# Patient Record
Sex: Male | Born: 1965 | Race: Black or African American | Hispanic: No | Marital: Married | Smoking: Never smoker
Health system: Southern US, Community
[De-identification: ages and names within clinical notes are randomized; demographics above are authoritative.]

## PROBLEM LIST (undated history)

## (undated) DIAGNOSIS — E119 Type 2 diabetes mellitus without complications: Secondary | ICD-10-CM

## (undated) DIAGNOSIS — K219 Gastro-esophageal reflux disease without esophagitis: Secondary | ICD-10-CM

---

## 1998-04-22 ENCOUNTER — Encounter: Admission: RE | Admit: 1998-04-22 | Discharge: 1998-07-21 | Payer: Self-pay | Admitting: Internal Medicine

## 2000-09-07 ENCOUNTER — Emergency Department (HOSPITAL_COMMUNITY): Admission: EM | Admit: 2000-09-07 | Discharge: 2000-09-07 | Payer: Self-pay | Admitting: *Deleted

## 2005-06-24 ENCOUNTER — Ambulatory Visit: Payer: Self-pay | Admitting: Internal Medicine

## 2005-06-29 ENCOUNTER — Ambulatory Visit: Payer: Self-pay | Admitting: Family Medicine

## 2005-06-30 ENCOUNTER — Ambulatory Visit: Payer: Self-pay | Admitting: Family Medicine

## 2008-06-05 ENCOUNTER — Emergency Department: Payer: Self-pay

## 2017-12-08 ENCOUNTER — Emergency Department: Payer: 59

## 2017-12-08 DIAGNOSIS — M546 Pain in thoracic spine: Secondary | ICD-10-CM | POA: Diagnosis not present

## 2017-12-08 DIAGNOSIS — R079 Chest pain, unspecified: Secondary | ICD-10-CM | POA: Diagnosis present

## 2017-12-08 DIAGNOSIS — E119 Type 2 diabetes mellitus without complications: Secondary | ICD-10-CM | POA: Insufficient documentation

## 2017-12-08 LAB — BASIC METABOLIC PANEL
Anion gap: 9 (ref 5–15)
BUN: 13 mg/dL (ref 6–20)
CHLORIDE: 103 mmol/L (ref 101–111)
CO2: 23 mmol/L (ref 22–32)
CREATININE: 0.95 mg/dL (ref 0.61–1.24)
Calcium: 9.3 mg/dL (ref 8.9–10.3)
GFR calc Af Amer: >60 mL/min (ref >60)
GFR calc non Af Amer: >60 mL/min (ref >60)
GLUCOSE: 155 mg/dL — AB (ref 65–99)
POTASSIUM: 3.8 mmol/L (ref 3.5–5.1)
SODIUM: 135 mmol/L (ref 135–145)

## 2017-12-08 LAB — CBC
HEMATOCRIT: 45.2 % (ref 40.0–52.0)
Hemoglobin: 14.4 g/dL (ref 13.0–18.0)
MCH: 26.4 pg (ref 26.0–34.0)
MCHC: 32 g/dL (ref 32.0–36.0)
MCV: 82.5 fL (ref 80.0–100.0)
Platelets: 208 10*3/uL (ref 150–440)
RBC: 5.47 MIL/uL (ref 4.40–5.90)
RDW: 14.5 % (ref 11.5–14.5)
WBC: 7.2 10*3/uL (ref 3.8–10.6)

## 2017-12-08 LAB — TROPONIN I: Troponin I: <0.03 ng/mL (ref <0.03)

## 2017-12-08 NOTE — ED Triage Notes (Signed)
Patient c/o neck pain, back pain radiating down left arm and weakness. Patient denies SOB, N/V, dizziness/light-headedness.

## 2017-12-09 ENCOUNTER — Emergency Department: Payer: 59

## 2017-12-09 ENCOUNTER — Emergency Department
Admission: EM | Admit: 2017-12-09 | Discharge: 2017-12-09 | Disposition: A | Discharge status: Home / Self Care | Payer: 59 | Attending: Emergency Medicine | Admitting: Emergency Medicine

## 2017-12-09 DIAGNOSIS — M546 Pain in thoracic spine: Secondary | ICD-10-CM

## 2017-12-09 HISTORY — DX: Type 2 diabetes mellitus without complications: E11.9

## 2017-12-09 HISTORY — DX: Gastro-esophageal reflux disease without esophagitis: K21.9

## 2017-12-09 MED ORDER — LIDOCAINE 5 % EX PTCH
1.0000 | MEDICATED_PATCH | Freq: Once | CUTANEOUS | Status: DC
  Administered 2017-12-09: 1 via TRANSDERMAL
  Filled 2017-12-09: qty 1

## 2017-12-09 MED ORDER — ONDANSETRON HCL 4 MG/2ML IJ SOLN
4.0000 mg | Freq: Once | INTRAMUSCULAR | Status: AC
  Administered 2017-12-09: 4 mg via INTRAVENOUS
  Filled 2017-12-09: qty 2

## 2017-12-09 MED ORDER — IOPAMIDOL (ISOVUE-370) INJECTION 76%
75.0000 mL | Freq: Once | INTRAVENOUS | Status: AC | PRN
  Administered 2017-12-09: 75 mL via INTRAVENOUS

## 2017-12-09 MED ORDER — LIDOCAINE 5 % EX PTCH: 1.0000 | MEDICATED_PATCH | Freq: Two times a day (BID) | CUTANEOUS | 0 refills | Status: AC

## 2017-12-09 MED ORDER — MORPHINE SULFATE (PF) 4 MG/ML IV SOLN
8.0000 mg | Freq: Once | INTRAVENOUS | Status: AC
  Administered 2017-12-09: 8 mg via INTRAVENOUS
  Filled 2017-12-09: qty 2

## 2017-12-09 MED ORDER — KETOROLAC TROMETHAMINE 30 MG/ML IJ SOLN
15.0000 mg | Freq: Once | INTRAMUSCULAR | Status: AC
  Administered 2017-12-09: 15 mg via INTRAVENOUS
  Filled 2017-12-09: qty 1

## 2017-12-09 MED ORDER — OXYCODONE-ACETAMINOPHEN 5-325 MG PO TABS: 1.0000 | ORAL_TABLET | ORAL | 0 refills | Status: DC | PRN

## 2017-12-09 MED ORDER — CYCLOBENZAPRINE HCL 10 MG PO TABS: 10.0000 mg | ORAL_TABLET | Freq: Three times a day (TID) | ORAL | 0 refills | Status: AC | PRN

## 2017-12-09 MED ORDER — OXYCODONE-ACETAMINOPHEN 5-325 MG PO TABS
2.0000 | ORAL_TABLET | Freq: Once | ORAL | Status: AC
  Administered 2017-12-09: 2 via ORAL
  Filled 2017-12-09: qty 2

## 2017-12-09 MED ORDER — CYCLOBENZAPRINE HCL 10 MG PO TABS
10.0000 mg | ORAL_TABLET | Freq: Once | ORAL | Status: AC
  Administered 2017-12-09: 10 mg via ORAL
  Filled 2017-12-09: qty 1

## 2017-12-09 NOTE — ED Provider Notes (Signed)
[facility] Emergency Department Provider Note  ____________________________________________   First MD Initiated Contact with Patient 12/09/17 [phone]     (approximate)  I have reviewed the triage vital signs and the nursing notes.   HISTORY  Chief Complaint Back Pain and Neck Pain   HPI Richard Sheppard is a 52 y.o. male comes to the emergency department with several days of insidious onset gradually progressive upper back pain.  The pain is severe throbbing aching.  Seems to be worse with movement and somewhat improved with rest.  Eyes numbness or weakness.  The pain seems to wrap somewhere around to his chest.  It is worse with deep inspiration.  He has had no particular trauma.  No numbness or weakness.  No fevers or chills.  No abdominal pain nausea or vomiting.  Past Medical History:  Diagnosis Date  . Diabetes mellitus without complication (HCC)   . GERD (gastroesophageal reflux disease)     There are no active problems to display for this patient.   History reviewed. No pertinent surgical history.  Prior to Admission medications   Medication Sig Start Date End Date Taking? Authorizing Provider  cyclobenzaprine (FLEXERIL) 10 MG tablet Take 1 tablet (10 mg total) by mouth 3 (three) times daily as needed for muscle spasms. 12/09/17   Merrily Brittleifenbark, Cassady Stanczak, MD  lidocaine (LIDODERM) 5 % Place 1 patch onto the skin every 12 (twelve) hours. Remove & Discard patch within 12 hours or as directed by MD 12/09/17 12/09/18  Merrily Brittleifenbark, Tanajah Boulter, MD  oxyCODONE-acetaminophen (PERCOCET/ROXICET) 5-325 MG tablet Take 1 tablet by mouth every 4 (four) hours as needed for severe pain. 12/09/17   Merrily Brittleifenbark, Dalene Robards, MD    Allergies Patient has no known allergies.  No family history on file.  Social History Social History   Tobacco Use  . Smoking status: Never Smoker  . Smokeless tobacco: Never Used  Substance Use Topics  . Alcohol use: No    Frequency: Never  . Drug use: Not on  file    Review of Systems Constitutional: No fever/chills Eyes: No visual changes. ENT: No sore throat. Cardiovascular: Positive for chest pain. Respiratory: Denies shortness of breath. Gastrointestinal: No abdominal pain.  No nausea, no vomiting.  No diarrhea.  No constipation. Genitourinary: Negative for dysuria. Musculoskeletal: Positive for back pain. Skin: Negative for rash. Neurological: Negative for headaches, focal weakness or numbness.   ____________________________________________   PHYSICAL EXAM:  VITAL SIGNS: ED Triage Vitals  Enc Vitals Group     BP 12/08/17 2145 129/90     Pulse Rate 12/08/17 2145 75     Resp 12/08/17 2145 18     Temp 12/08/17 2145 98.9 F (37.2 C)     Temp Source 12/08/17 2145 Oral     SpO2 12/08/17 2145 99 %     Weight 12/08/17 2145 (!) 318 lb (144.2 kg)     Height 12/08/17 2145 6' 1.5" (1.867 m)     Head Circumference --      Peak Flow --      Pain Score 12/08/17 2154 5     Pain Loc --      Pain Edu? --      Excl. in GC? --     Constitutional: Alert and oriented x4 sitting very still appears extremely uncomfortable Eyes: PERRL EOMI. Head: Atraumatic. Nose: No congestion/rhinnorhea. Mouth/Throat: No trismus Neck: No stridor.   Cardiovascular: Normal rate, regular rhythm. Grossly normal heart sounds.  Good peripheral circulation. Respiratory: Normal respiratory effort.  No retractions. Lungs CTAB and moving good air Gastrointestinal: Soft nontender Musculoskeletal: Exquisitely tender left greater than right thoracic paraspinal with spasm Neurologic:  Normal speech and language. No gross focal neurologic deficits are appreciated. Skin:  Skin is warm, dry and intact. No rash noted. Psychiatric: Mood and affect are normal. Speech and behavior are normal.    ____________________________________________   DIFFERENTIAL includes but not limited to  Pulmonary embolism, aortic dissection, cardiac tamponade, musculoskeletal pain,  acute coronary syndrome ____________________________________________   LABS (all labs ordered are listed, but only abnormal results are displayed)  Labs Reviewed  BASIC METABOLIC PANEL - Abnormal; Notable for the following components:      Result Value   Glucose, Bld 155 (*)    All other components within normal limits  CBC  TROPONIN I    Lab work reviewed by me with no acute disease __________________________________________  EKG  ED ECG REPORT I, Merrily Brittle, the attending physician, personally viewed and interpreted this ECG.  Date: 12/09/2017 EKG Time:  Rate: 77 Rhythm: normal sinus rhythm QRS Axis: normal Intervals: normal ST/T Wave abnormalities: normal Narrative Interpretation: no evidence of acute ischemia  ____________________________________________  RADIOLOGY  CT angiogram of the chest reviewed by me with no acute disease ____________________________________________   PROCEDURES  Procedure(s) performed: no  Procedures  Critical Care performed: no  Observation: no ____________________________________________   INITIAL IMPRESSION / ASSESSMENT AND PLAN / ED COURSE  Pertinent labs & imaging results that were available during my care of the patient were reviewed by me and considered in my medical decision making (see chart for details).  The patient arrives extremely uncomfortable appearing with exquisite focal back tenderness and difficulty moving.  Given 8 mg of morphine and Toradol which only minimally improved his symptoms.  His symptoms do seem most likely musculoskeletal however given the severity of his pain I am concerned that he could have an aortic dissection versus a pulmonary embolism.  CT angiogram obtained which fortunately is negative for acute pathology.  This point I will treat the patient symptomatically for musculoskeletal pain with muscle relaxants.  He does feel improved.  Will be discharged home with a short course with strict  return precautions.  The patient and his daughter verbalized understanding and agreement with plan.      ____________________________________________   FINAL CLINICAL IMPRESSION(S) / ED DIAGNOSES  Final diagnoses:  Acute bilateral thoracic back pain      NEW MEDICATIONS STARTED DURING THIS VISIT:  Discharge Medication List as of 12/09/2017  4:16 AM    START taking these medications   Details  cyclobenzaprine (FLEXERIL) 10 MG tablet Take 1 tablet (10 mg total) by mouth 3 (three) times daily as needed for muscle spasms., Starting Sat 12/09/2017, Print    lidocaine (LIDODERM) 5 % Place 1 patch onto the skin every 12 (twelve) hours. Remove & Discard patch within 12 hours or as directed by MD, Starting Sat 12/09/2017, Until Sun 12/09/2018, Print    oxyCODONE-acetaminophen (PERCOCET/ROXICET) 5-325 MG tablet Take 1 tablet by mouth every 4 (four) hours as needed for severe pain., Starting Sat 12/09/2017, Print         Note:  This document was prepared using Dragon voice recognition software and may include unintentional dictation errors.     Merrily Brittle, MD 12/11/17 [phone]

## 2017-12-09 NOTE — ED Notes (Signed)
Report from lorrie, rn.  

## 2017-12-09 NOTE — ED Notes (Signed)
Pt requesting additional pain medication. Pt states he has not had pain medication. Pt informed previous rn administered 8mg  of morphine and toradol. md notified of pt's request, no new orders received, md states he will be speaking to pt shortly regarding treatment plan. Pt informed md will be in to speak with him regarding treatment plan.

## 2017-12-09 NOTE — Discharge Instructions (Signed)
Fortunately today your blood work and your CT scan were reassuring.  Please take your pain medication as needed for severe symptoms and follow-up with primary care within the next 2 days for reevaluation.  Return to the emergency department sooner for any concerns.  It was a pleasure to take care of you today, and thank you for coming to our emergency department.  If you have any questions or concerns before leaving please ask the nurse to grab me and I'm more than happy to go through your aftercare instructions again.  If you were prescribed any opioid pain medication today such as Norco, Vicodin, Percocet, morphine, hydrocodone, or oxycodone please make sure you do not drive when you are taking this medication as it can alter your ability to drive safely.  If you have any concerns once you are home that you are not improving or are in fact getting worse before you can make it to your follow-up appointment, please do not hesitate to call 911 and come back for further evaluation.  Merrily BrittleNeil Leeandre Nordling, MD  Results for orders placed or performed during the hospital encounter of 12/09/17  Basic metabolic panel  Result Value Ref Range   Sodium 135 135 - 145 mmol/L   Potassium 3.8 3.5 - 5.1 mmol/L   Chloride 103 101 - 111 mmol/L   CO2 23 22 - 32 mmol/L   Glucose, Bld 155 (H) 65 - 99 mg/dL   BUN 13 6 - 20 mg/dL   Creatinine, Ser 1.300.95 0.61 - 1.24 mg/dL   Calcium 9.3 8.9 - 86.510.3 mg/dL   GFR calc non Af Amer >60 >60 mL/min   GFR calc Af Amer >60 >60 mL/min   Anion gap 9 5 - 15  CBC  Result Value Ref Range   WBC 7.2 3.8 - 10.6 K/uL   RBC 5.47 4.40 - 5.90 MIL/uL   Hemoglobin 14.4 13.0 - 18.0 g/dL   HCT 78.445.2 69.640.0 - 29.552.0 %   MCV 82.5 80.0 - 100.0 fL   MCH 26.4 26.0 - 34.0 pg   MCHC 32.0 32.0 - 36.0 g/dL   RDW 28.414.5 13.211.5 - 44.014.5 %   Platelets 208 150 - 440 K/uL  Troponin I  Result Value Ref Range   Troponin I <0.03 <0.03 ng/mL   Dg Chest 2 View  Result Date: 12/08/2017 CLINICAL DATA:  Neck and back  pain radiating to LEFT arm and, weakness for 2 days. EXAM: CHEST  2 VIEW COMPARISON:  None. FINDINGS: Cardiomediastinal silhouette is normal. No pleural effusions or focal consolidations. Trachea projects midline and there is no pneumothorax. Soft tissue planes and included osseous structures are non-suspicious. Large body habitus. IMPRESSION: Negative chest radiograph. Electronically Signed   By: Awilda Metroourtnay  Bloomer M.D.   On: 12/08/2017 22:29   Dg Thoracic Spine 2 View  Result Date: 12/08/2017 CLINICAL DATA:  Neck, back pain radiating to LEFT arm with weakness for 2 days. EXAM: THORACIC SPINE 2 VIEWS COMPARISON:  None. FINDINGS: There is no evidence of thoracic spine fracture. Alignment is normal. Mild lower thoracic and upper lumbar endplate spurring. Moderate approximate C5-6 degenerative disc.No other significant bone abnormalities are identified. IMPRESSION: No fracture deformity or malalignment. Electronically Signed   By: Awilda Metroourtnay  Bloomer M.D.   On: 12/08/2017 22:30   Ct Angio Chest Pe W/cm &/or Wo Cm  Result Date: 12/09/2017 CLINICAL DATA:  Neck pain and back pain radiating down the left arm. Weakness. EXAM: CT ANGIOGRAPHY CHEST WITH CONTRAST TECHNIQUE: Multidetector CT imaging of the  chest was performed using the standard protocol during bolus administration of intravenous contrast. Multiplanar CT image reconstructions and MIPs were obtained to evaluate the vascular anatomy. CONTRAST:  75mL ISOVUE-370 IOPAMIDOL (ISOVUE-370) INJECTION 76% COMPARISON:  None. FINDINGS: Cardiovascular: Good opacification of the central and segmental pulmonary arteries. No focal filling defects. No evidence of significant pulmonary embolus. Normal heart size. No pericardial effusion. Scattered coronary artery calcifications. Normal caliber thoracic aorta great vessel origins are patent. No aortic dissection. Mediastinum/Nodes: No enlarged mediastinal, hilar, or axillary lymph nodes. Thyroid gland, trachea, and esophagus  demonstrate no significant findings. Lungs/Pleura: Lungs are clear. No pleural effusion or pneumothorax. Upper Abdomen: No acute abnormality. Musculoskeletal: No chest wall abnormality. No acute or significant osseous findings. Review of the MIP images confirms the above findings. IMPRESSION: 1. No evidence of significant pulmonary embolus. 2. No evidence of active pulmonary disease. 3. Scattered coronary artery calcifications. Electronically Signed   By: Burman Nieves M.D.   On: 12/09/2017 04:07

## 2017-12-11 ENCOUNTER — Ambulatory Visit
Admission: EM | Admit: 2017-12-11 | Discharge: 2017-12-11 | Disposition: A | Discharge status: Home / Self Care | Payer: 59 | Attending: Family Medicine | Admitting: Family Medicine

## 2017-12-11 ENCOUNTER — Encounter: Payer: Self-pay | Admitting: *Deleted

## 2017-12-11 ENCOUNTER — Other Ambulatory Visit: Payer: Self-pay

## 2017-12-11 DIAGNOSIS — M5412 Radiculopathy, cervical region: Secondary | ICD-10-CM | POA: Diagnosis not present

## 2017-12-11 MED ORDER — OXYCODONE-ACETAMINOPHEN 5-325 MG PO TABS: 1.0000 | ORAL_TABLET | Freq: Three times a day (TID) | ORAL | 0 refills | Status: AC | PRN

## 2017-12-11 MED ORDER — PREDNISONE 20 MG PO TABS: ORAL_TABLET | ORAL | 0 refills | Status: AC

## 2017-12-11 NOTE — ED Provider Notes (Signed)
[facility]    CSN: [id number] Arrival date & time: 12/11/17  1222     History   Chief Complaint Chief Complaint  Patient presents with  . Neck Pain    HPI Richard Sheppard is a 52 y.o. male.   52 yo male with a c/o severe neck pain radiating down the arms. Denies any fall or direct traumatic injury. Denies numbness/tingling, fevers, chills. Was seen in ED 2 days ago but states not improved. Saw his chiropractor today as well but states was not able to have any treatment due to severe pain.    The history is provided by the patient.  Neck Pain    Past Medical History:  Diagnosis Date  . Diabetes mellitus without complication (HCC)   . GERD (gastroesophageal reflux disease)     There are no active problems to display for this patient.   History reviewed. No pertinent surgical history.     Home Medications    Prior to Admission medications   Medication Sig Start Date End Date Taking? Authorizing Provider  cyclobenzaprine (FLEXERIL) 10 MG tablet Take 1 tablet (10 mg total) by mouth 3 (three) times daily as needed for muscle spasms. 12/09/17  Yes Merrily Brittle, MD  lidocaine (LIDODERM) 5 % Place 1 patch onto the skin every 12 (twelve) hours. Remove & Discard patch within 12 hours or as directed by MD 12/09/17 12/09/18 Yes Merrily Brittle, MD  oxyCODONE-acetaminophen (PERCOCET/ROXICET) 5-325 MG tablet Take 1 tablet by mouth every 8 (eight) hours as needed for severe pain. 12/11/17   Payton Mccallum, MD  predniSONE (DELTASONE) 20 MG tablet 3 tabs po qd x 2 days, then 2 tabs po qd x 3 days, then 1 tab po qd x 3 days, then half a tab po qd x 2 days 12/11/17   Payton Mccallum, MD    Family History Family History  Problem Relation Age of Onset  . Diabetes Mother   . Diabetes Father     Social History Social History   Tobacco Use  . Smoking status: Never Smoker  . Smokeless tobacco: Never Used  Substance Use Topics  . Alcohol use: No    Frequency: Never  . Drug  use: No     Allergies   Patient has no known allergies.   Review of Systems Review of Systems  Musculoskeletal: Positive for neck pain.     Physical Exam Triage Vital Signs ED Triage Vitals  Enc Vitals Group     BP 12/11/17 1332 (!) 141/104     Pulse Rate 12/11/17 1332 (!) 107     Resp 12/11/17 1332 18     Temp 12/11/17 1332 98.7 F (37.1 C)     Temp Source 12/11/17 1332 Oral     SpO2 12/11/17 1332 99 %     Weight --      Height --      Head Circumference --      Peak Flow --      Pain Score 12/11/17 1333 10     Pain Loc --      Pain Edu? --      Excl. in GC? --    No data found.  Updated Vital Signs BP (!) 141/104 (BP Location: Left Arm)   Pulse (!) 107   Temp 98.7 F (37.1 C) (Oral)   Resp 18   SpO2 99%   Visual Acuity Right Eye Distance:   Left Eye Distance:   Bilateral Distance:    Right  Eye Near:   Left Eye Near:    Bilateral Near:     Physical Exam  Constitutional: He appears well-developed and well-nourished. No distress.  Neck: Normal range of motion. Neck supple. No tracheal deviation present.  Pulmonary/Chest: Effort normal. No stridor. No respiratory distress.  Musculoskeletal:       Cervical back: He exhibits tenderness (over the trapezius muscles bilaterally (right greater than left)) and spasm. He exhibits normal range of motion, no bony tenderness, no swelling, no edema, no deformity, no laceration, no pain and normal pulse.  Neurological: He is alert. He has normal reflexes. He displays normal reflexes. He exhibits normal muscle tone. Coordination normal.  Skin: No rash noted. He is not diaphoretic.  Nursing note and vitals reviewed.    UC Treatments / Results  Labs (all labs ordered are listed, but only abnormal results are displayed) Labs Reviewed - No data to display  EKG  EKG Interpretation None       Radiology No results found.  Procedures Procedures (including critical care time)  Medications Ordered in  UC Medications - No data to display   Initial Impression / Assessment and Plan / UC Course  I have reviewed the triage vital signs and the nursing notes.  Pertinent labs & imaging results that were available during my care of the patient were reviewed by me and considered in my medical decision making (see chart for details).       Final Clinical Impressions(s) / UC Diagnoses   Final diagnoses:  Cervical radiculopathy    ED Discharge Orders        Ordered    predniSONE (DELTASONE) 20 MG tablet     12/11/17 1357    oxyCODONE-acetaminophen (PERCOCET/ROXICET) 5-325 MG tablet  Every 8 hours PRN     12/11/17 1401     1. diagnosis reviewed with patient 2. rx as per orders above; reviewed possible side effects, interactions, risks and benefits  3. Recommend supportive treatment with heat to area, gentle range of motion and stretching 4. Establish care with PCP  5. Follow-up prn if symptoms worsen or don't improve  Controlled Substance Prescriptions [place] Controlled Substance Registry consulted? Yes, I have consulted the [place] Controlled Substances Registry for this patient, and feel the risk/benefit ratio today is favorable for proceeding with this prescription for a controlled substance.   Payton Mccallumonty, Aigner Horseman, MD 12/11/17 [phone]

## 2017-12-11 NOTE — ED Triage Notes (Signed)
Patient awoke 4 days ago with severe neck pain and bilateral shoulder pain.

## 2017-12-14 ENCOUNTER — Telehealth: Payer: Self-pay

## 2017-12-14 NOTE — Telephone Encounter (Signed)
Called to follow up with patient since [facility]. Tried calling patient, no answer. Patient will call back with any questions or concerns. [facility]

## 2019-10-07 IMAGING — CT CT ANGIO CHEST
2 of 6 series · 19 of 46 positions shown · IV contrast (APPLIED)
Comparison: None.

CLINICAL DATA: Neck pain and back pain radiating down the left arm.
Weakness.

EXAM:
CT ANGIOGRAPHY CHEST WITH CONTRAST
TECHNIQUE: Multidetector CT imaging of the chest was performed using the
standard protocol during bolus administration of intravenous
contrast. Multiplanar CT image reconstructions and MIPs were
obtained to evaluate the vascular anatomy.
CONTRAST:  75mL PU0KJO-AQJ IOPAMIDOL (PU0KJO-AQJ) INJECTION 76%

[Series 5: thins · axial · 0.86mm/px · z∈[-536,-272]mm · 16 of 289 slices shown]
[im 13/289  lung]
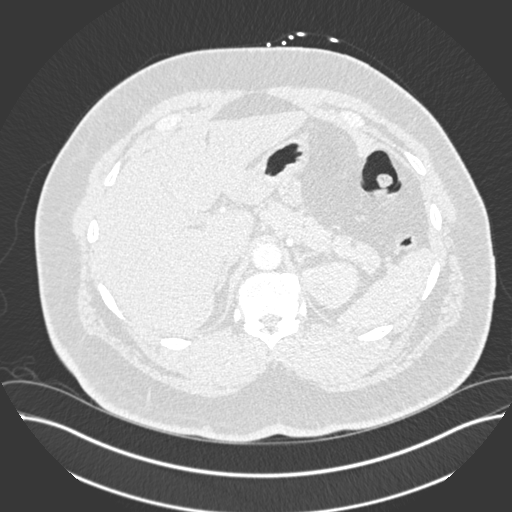
[im 38/289  soft-tissue]
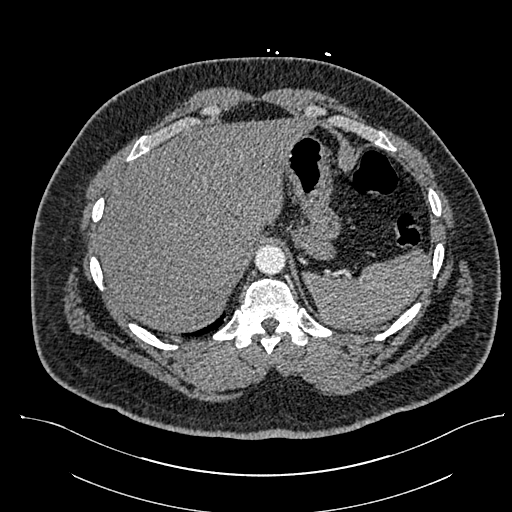
[im 51/289  lung]
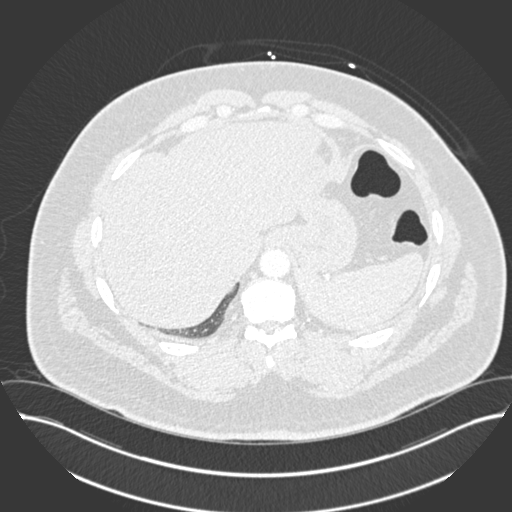
[im 63/289  soft-tissue]
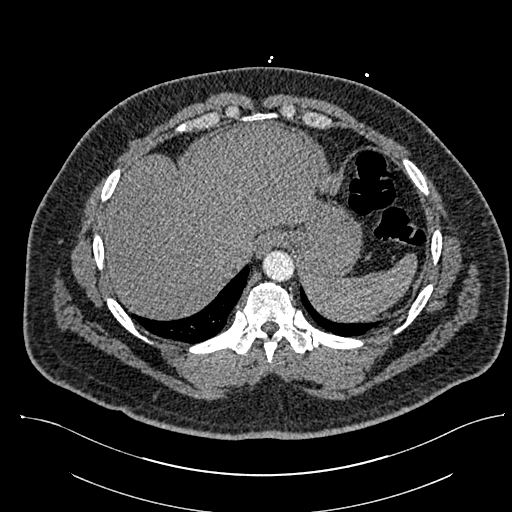
[im 88/289  lung]
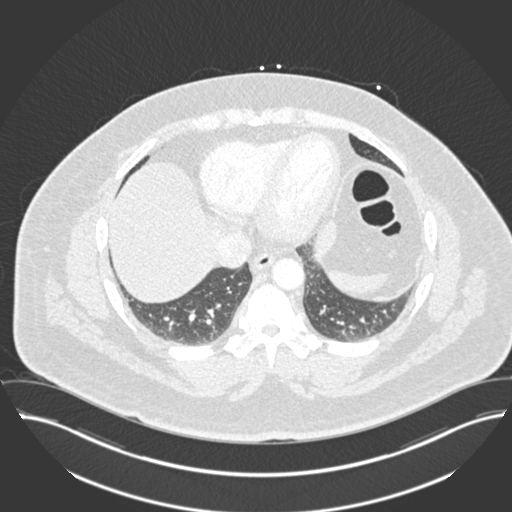
[im 101/289  soft-tissue]
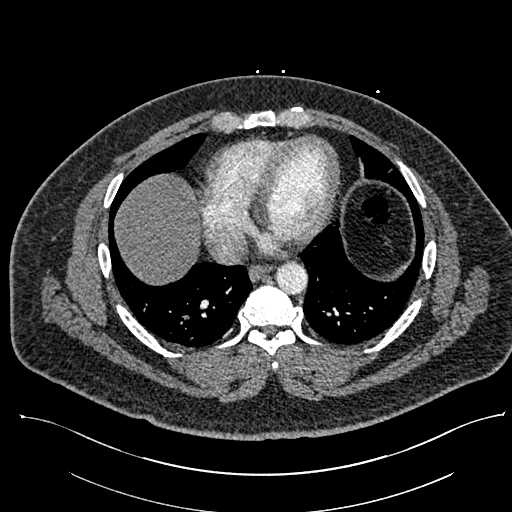
[im 113/289  lung]
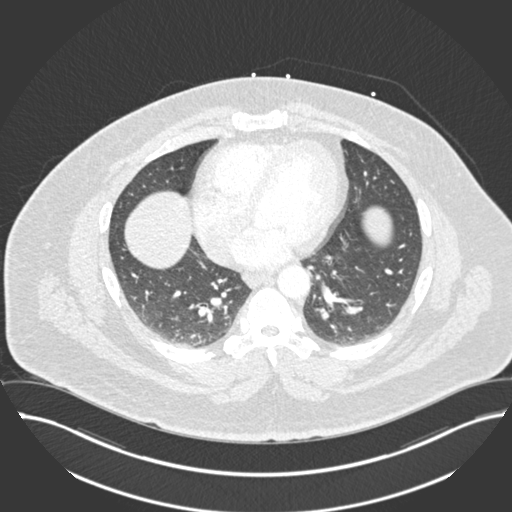
[im 138/289  soft-tissue]
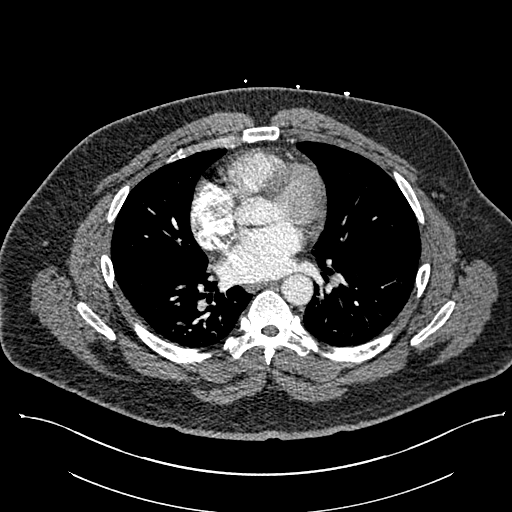
[im 151/289  lung]
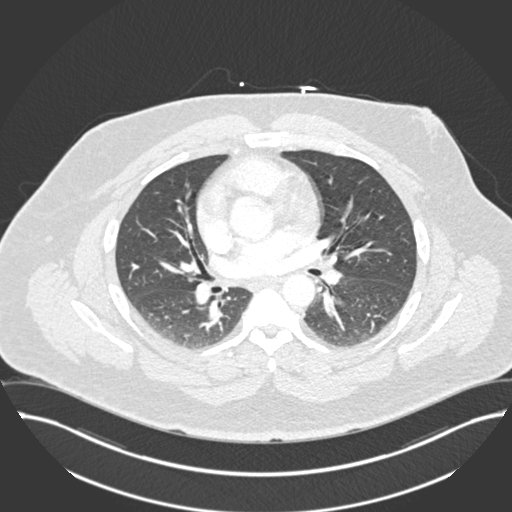
[im 176/289  soft-tissue]
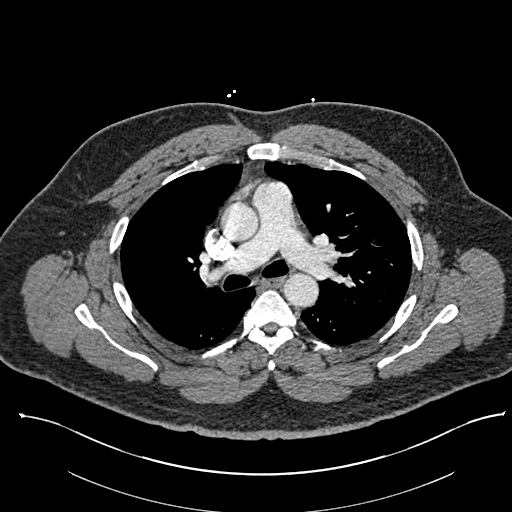
[im 188/289  lung]
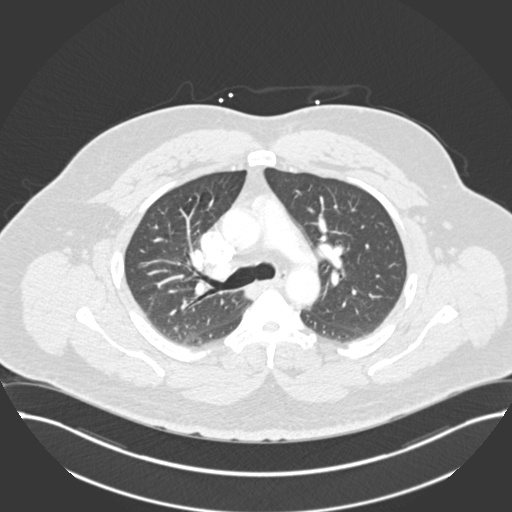
[im 201/289  soft-tissue]
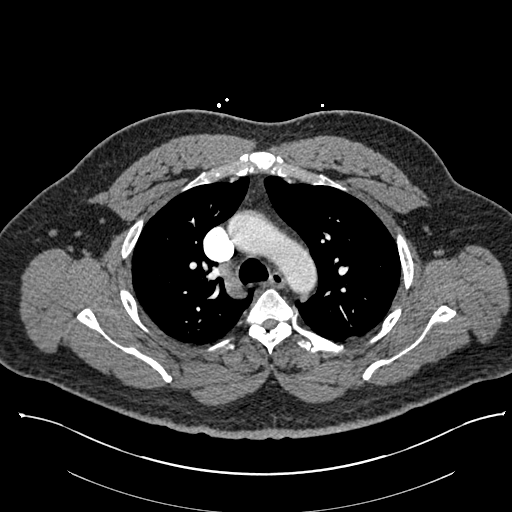
[im 226/289  lung]
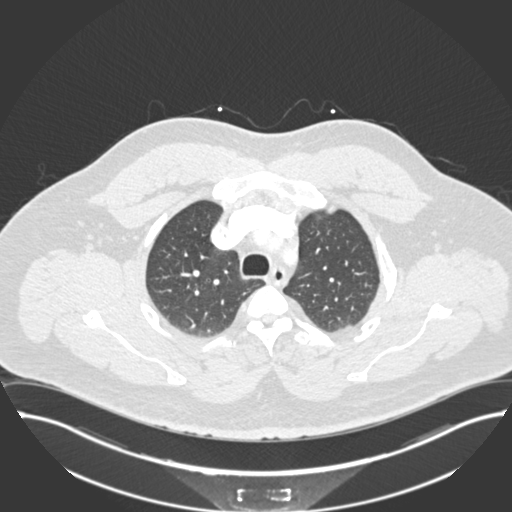
[im 238/289  soft-tissue]
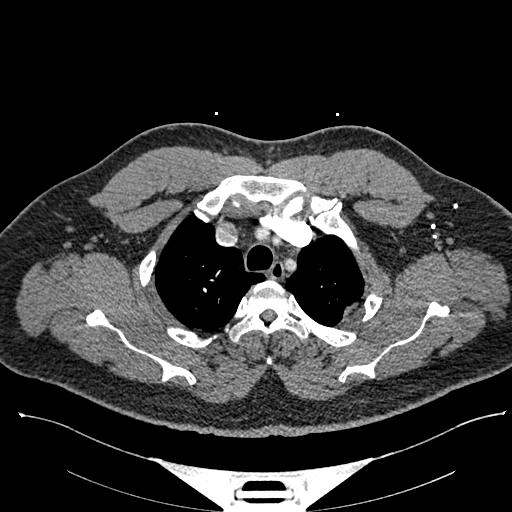
[im 251/289  lung]
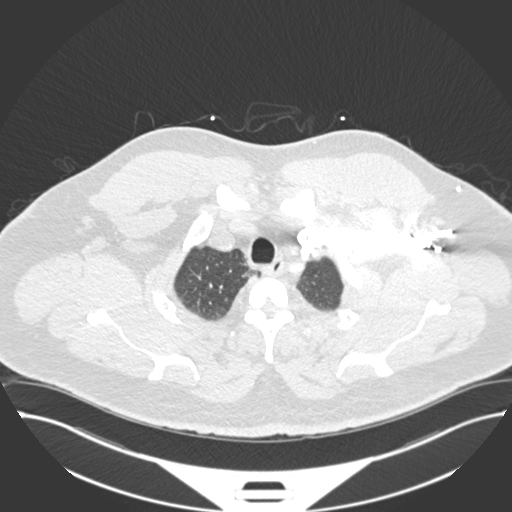
[im 276/289  soft-tissue]
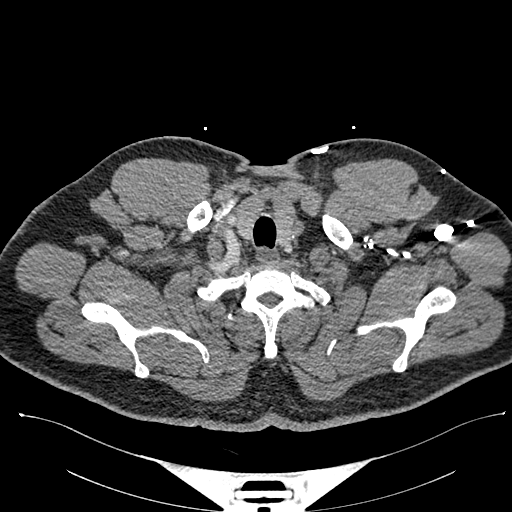

[Series 7: coronal mpr · coronal · 0.60mm/px · 3 of 88 slices shown]
[im 22/88  soft-tissue]
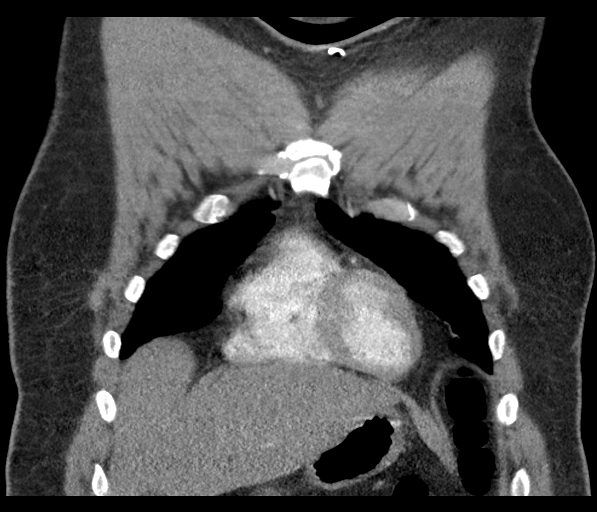
[im 44/88  soft-tissue]
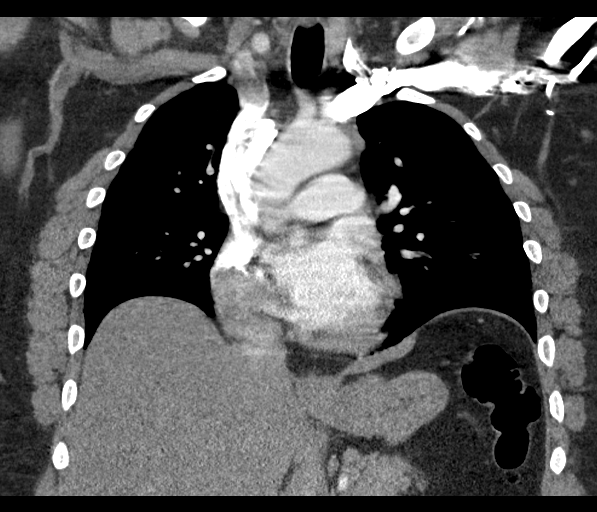
[im 66/88  soft-tissue]
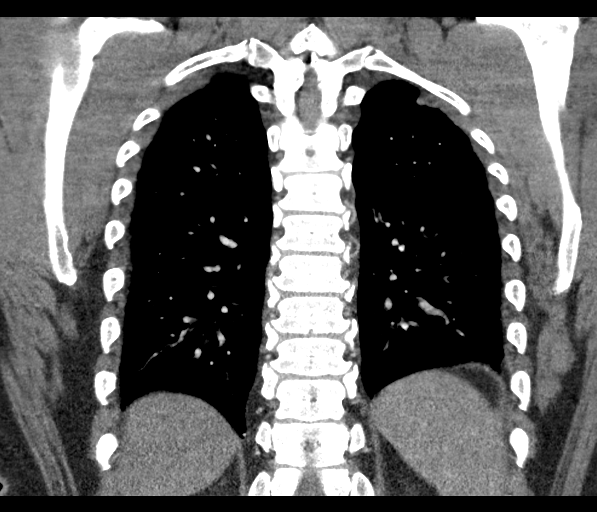

[19 of 46 positions shown; findings below may reference images not displayed]

FINDINGS: Cardiovascular: Good opacification of the central and segmental
pulmonary arteries. No focal filling defects. No evidence of
significant pulmonary embolus. Normal heart size. No pericardial
effusion. Scattered coronary artery calcifications. Normal caliber
thoracic aorta great vessel origins are patent. No aortic
dissection.

Mediastinum/Nodes: No enlarged mediastinal, hilar, or axillary lymph
nodes. Thyroid gland, trachea, and esophagus demonstrate no
significant findings.

Lungs/Pleura: Lungs are clear. No pleural effusion or pneumothorax.

Upper Abdomen: No acute abnormality.

Musculoskeletal: No chest wall abnormality. No acute or significant
osseous findings.

Review of the MIP images confirms the above findings.
IMPRESSION: 1. No evidence of significant pulmonary embolus.
2. No evidence of active pulmonary disease.
3. Scattered coronary artery calcifications.
